# Patient Record
Sex: Male | Born: 2000 | Race: White | Hispanic: No | Marital: Single
Health system: Southern US, Community
[De-identification: ages and names within clinical notes are randomized; demographics above are authoritative.]

---
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
## 2024-04-25 ENCOUNTER — Ambulatory Visit: Attending: Pediatrics

## 2024-04-25 DIAGNOSIS — G8929 Other chronic pain: Secondary | ICD-10-CM | POA: Insufficient documentation

## 2024-04-25 DIAGNOSIS — M25511 Pain in right shoulder: Secondary | ICD-10-CM | POA: Insufficient documentation

## 2024-04-25 NOTE — Therapy (Signed)
 Physical Therapy Screening   [facility] at [facility] [address] [place], [place], [address] Phone: [phone]   Fax:  [phone]  Patient Details  Name: Roy Arnold MRN: [id number] Date of Birth: 2000/09/16 Referring Provider:  Clarance Elvie BRAVO, MD  Encounter Date: 04/25/2024  Subjective:   Pt reports chronic lingering pain in anterior right shoulder. Onset after sleeping with arm overhead. Pt has post workout pain after heavy barbell bench press or full depth dips. Pt has more joint noise that he used to while performing wrist pronation, supination in open chain unloaded, whereas he has always noticed som eslight joint noise with rotation of GHJ during warmups. Not sure that shoulder press has been as aggravating. He has always noticed some asymetrry of shoulders with getting barbell into a back squat position.  Objective:  -overhead lift mobility assessment with mild shoulder hypermobility and hypomobility of scapulothoracic joints.   -MMT of bilat shoulder flexion, infraspinatus, and subscapularis all 5/5 and pain free.   -front raise 1x10 bilat with 9lb dumbbell revealing of subjective strength impairment in latter half of reps, no pain, but some fatigue tremor as well.   -deep palpation of anterior deltoid marked for substantial portions with taut bands, quite tender, as is Rt pec minor.   -thoracic extension on towel roll more notable for subjective complaint of stiffness.   -audible, painfree GHJ cavitation when moving into Rt IR behind the back.   Assessment:   -some degree of segmental mobility imbalance between GHJ and thoracic spine.  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   Astryd Pearcy C, PT 04/25/2024, 3:31 PM   [facility] at [facility] [address] [place], [place], [address] Phone: [phone]   Fax:  [phone]
